# Patient Record
Sex: Female | Born: 1960 | Race: White | Hispanic: No | Marital: Married | Smoking: Current every day smoker
Health system: Southern US, Community
[De-identification: ages and names within clinical notes are randomized; demographics above are authoritative.]

---

## 2004-01-14 ENCOUNTER — Encounter: Admission: RE | Admit: 2004-01-14 | Discharge: 2004-01-14 | Payer: Self-pay | Admitting: Psychiatry

## 2005-04-08 ENCOUNTER — Emergency Department (HOSPITAL_COMMUNITY): Admission: EM | Admit: 2005-04-08 | Discharge: 2005-04-09 | Payer: Self-pay | Admitting: Emergency Medicine

## 2005-06-05 ENCOUNTER — Ambulatory Visit (HOSPITAL_COMMUNITY): Admission: RE | Admit: 2005-06-05 | Discharge: 2005-06-06 | Payer: Self-pay | Admitting: Neurosurgery

## 2011-01-07 ENCOUNTER — Encounter: Payer: Self-pay | Admitting: Unknown Physician Specialty

## 2013-08-14 ENCOUNTER — Telehealth: Payer: Self-pay | Admitting: Family Medicine

## 2013-08-14 NOTE — Telephone Encounter (Signed)
Patient needs something called in for her possible abscessed tooth to [facility]. Her husband came in this morning(David Lai) and was told that we could do this for her since the dentist is not open. Either antibiotic or pain medication. She has not been seen since 2009.

## 2013-08-14 NOTE — Telephone Encounter (Signed)
Called in call in penicillin VK 500 mg 1 4 times a day #28, hydrocodone 5 mg/325 mg 1 every 4 hours as needed for severe pain cautioned drowsiness #24 no refills to [facility]. Left message on voicemail notifying patient.

## 2013-08-14 NOTE — Telephone Encounter (Signed)
May call in penicillin VK 500 mg 1 4 times a day #28, hydrocodone 5 mg/325 mg 1 every 4 hours as needed for severe pain cautioned drowsiness #24 no refills, please make sure no allergies thank you, followup with dentist

## 2018-04-18 ENCOUNTER — Encounter (HOSPITAL_COMMUNITY): Payer: Self-pay

## 2018-04-18 ENCOUNTER — Emergency Department (HOSPITAL_COMMUNITY): Payer: Self-pay

## 2018-04-18 ENCOUNTER — Other Ambulatory Visit: Payer: Self-pay

## 2018-04-18 ENCOUNTER — Emergency Department (HOSPITAL_COMMUNITY)
Admission: EM | Admit: 2018-04-18 | Discharge: 2018-04-18 | Disposition: A | Discharge status: Home / Self Care | Payer: Self-pay | Attending: Emergency Medicine | Admitting: Emergency Medicine

## 2018-04-18 DIAGNOSIS — R202 Paresthesia of skin: Secondary | ICD-10-CM | POA: Insufficient documentation

## 2018-04-18 DIAGNOSIS — J9801 Acute bronchospasm: Secondary | ICD-10-CM | POA: Insufficient documentation

## 2018-04-18 DIAGNOSIS — J209 Acute bronchitis, unspecified: Secondary | ICD-10-CM

## 2018-04-18 DIAGNOSIS — J40 Bronchitis, not specified as acute or chronic: Secondary | ICD-10-CM | POA: Insufficient documentation

## 2018-04-18 DIAGNOSIS — F17210 Nicotine dependence, cigarettes, uncomplicated: Secondary | ICD-10-CM | POA: Insufficient documentation

## 2018-04-18 LAB — COMPREHENSIVE METABOLIC PANEL
ALT: 16 U/L (ref 14–54)
AST: 20 U/L (ref 15–41)
Albumin: 3.8 g/dL (ref 3.5–5.0)
Alkaline Phosphatase: 63 U/L (ref 38–126)
Anion gap: 12 (ref 5–15)
BUN: 10 mg/dL (ref 6–20)
CHLORIDE: 107 mmol/L (ref 101–111)
CO2: 21 mmol/L — ABNORMAL LOW (ref 22–32)
Calcium: 9.2 mg/dL (ref 8.9–10.3)
Creatinine, Ser: 0.7 mg/dL (ref 0.44–1.00)
GFR calc Af Amer: >60 mL/min (ref >60)
Glucose, Bld: 112 mg/dL — ABNORMAL HIGH (ref 65–99)
POTASSIUM: 3.4 mmol/L — AB (ref 3.5–5.1)
Sodium: 140 mmol/L (ref 135–145)
Total Bilirubin: 0.4 mg/dL (ref 0.3–1.2)
Total Protein: 6.9 g/dL (ref 6.5–8.1)

## 2018-04-18 LAB — CBC WITH DIFFERENTIAL/PLATELET
BASOS ABS: 0 10*3/uL (ref 0.0–0.1)
Basophils Relative: 1 %
Eosinophils Absolute: 0.2 10*3/uL (ref 0.0–0.7)
Eosinophils Relative: 3 %
HEMATOCRIT: 40 % (ref 36.0–46.0)
HEMOGLOBIN: 13.6 g/dL (ref 12.0–15.0)
Lymphocytes Relative: 28 %
Lymphs Abs: 1.8 10*3/uL (ref 0.7–4.0)
MCH: 33.5 pg (ref 26.0–34.0)
MCHC: 34 g/dL (ref 30.0–36.0)
MCV: 98.5 fL (ref 78.0–100.0)
Monocytes Absolute: 0.6 10*3/uL (ref 0.1–1.0)
Monocytes Relative: 9 %
NEUTROS ABS: 3.9 10*3/uL (ref 1.7–7.7)
Neutrophils Relative %: 59 %
Platelets: 284 10*3/uL (ref 150–400)
RBC: 4.06 MIL/uL (ref 3.87–5.11)
RDW: 12.6 % (ref 11.5–15.5)
WBC: 6.4 10*3/uL (ref 4.0–10.5)

## 2018-04-18 LAB — BRAIN NATRIURETIC PEPTIDE: B Natriuretic Peptide: 35 pg/mL (ref 0.0–100.0)

## 2018-04-18 LAB — TROPONIN I: Troponin I: <0.03 ng/mL (ref <0.03)

## 2018-04-18 LAB — ETHANOL: ALCOHOL ETHYL (B): 229 mg/dL — AB (ref <10)

## 2018-04-18 LAB — D-DIMER, QUANTITATIVE (NOT AT ARMC): D DIMER QUANT: 0.27 ug{FEU}/mL (ref 0.00–0.50)

## 2018-04-18 MED ORDER — PREDNISONE 20 MG PO TABS: ORAL_TABLET | ORAL | 0 refills | Status: DC

## 2018-04-18 MED ORDER — AMOXICILLIN 500 MG PO CAPS: 500.0000 mg | ORAL_CAPSULE | Freq: Three times a day (TID) | ORAL | 0 refills | Status: DC

## 2018-04-18 MED ORDER — ALBUTEROL SULFATE (2.5 MG/3ML) 0.083% IN NEBU
5.0000 mg | INHALATION_SOLUTION | Freq: Once | RESPIRATORY_TRACT | Status: AC
  Administered 2018-04-18: 5 mg via RESPIRATORY_TRACT
  Filled 2018-04-18: qty 6

## 2018-04-18 MED ORDER — AEROCHAMBER Z-STAT PLUS/MEDIUM MISC
1.0000 | Freq: Once | Status: AC
  Administered 2018-04-18: 1
  Filled 2018-04-18: qty 1

## 2018-04-18 MED ORDER — ALBUTEROL SULFATE HFA 108 (90 BASE) MCG/ACT IN AERS
2.0000 | INHALATION_SPRAY | Freq: Four times a day (QID) | RESPIRATORY_TRACT | Status: DC | PRN
  Administered 2018-04-18: 2 via RESPIRATORY_TRACT
  Filled 2018-04-18: qty 6.7

## 2018-04-18 NOTE — ED Triage Notes (Signed)
Pt arrives from home c/o difficulty breathing, accompanied with tingling in her arms. States symptoms have been occurring X 2 days.

## 2018-04-18 NOTE — ED Notes (Signed)
Patient transported to CT 

## 2018-04-18 NOTE — Discharge Instructions (Signed)
Try to stop smoking and cut back on your alcohol ingestion. Take the antibiotics and the prednisone until gone. Use the inhaler 2 puffs every 6 hrs as needed for wheezing or shortness of breath. Please let Dr. Gerda Diss know about the numbness you have been having in your left hand if it continues. Return to the ED if you get a fever, struggle to breathe despite using your inhaler, if you get weakness in an arm or leg or you feel worse.

## 2018-04-18 NOTE — ED Notes (Signed)
ED Provider at bedside. 

## 2018-04-18 NOTE — ED Notes (Signed)
Pt returned from CT Scan 

## 2018-04-18 NOTE — ED Provider Notes (Signed)
[facility] EMERGENCY DEPARTMENT Provider Note   CSN: [id number] Arrival date & time: 04/18/18  0022  Time seen 1:15 AM   History   Chief Complaint Chief Complaint  Patient presents with  . Shortness of Breath    HPI Kimberly Thompson is a 57 y.o. female.  HPI patient states about 7 PM this evening she started getting short of breath that she states was gradually and it got worse.  She states she has had a cough for a few months.  She states she has been wheezing and has had a nebulizer and inhaler in the past but does not have one now.  She states she is coughing up clear mucus and she has sneezing and clear rhinorrhea.  She denies sore throat but does have nausea without vomiting.  She states she has been having headaches for a few weeks that come and go and she had it earlier but it is gone now.  She denies chest pain.  She did states she has some neck pain, she calls that shoulder pain however she puts her hand on her neck.  She also complains of left upper extremity numbness and weakness off and on for the past 3 weeks that she states last from 1 to 2 to 3 minutes at a time.  She states she is right-handed and feels like she is weaker in her right hand than her left currently.  Her son states tonight they were trying to help her to go out to the car to come to the ED and she started having tunnel vision and then could not see.  She has been complaining of visual problems also for the last couple weeks.  PCP Babs Sciara, MD   History reviewed. No pertinent past medical history.  There are no active problems to display for this patient.   History reviewed. No pertinent surgical history.   OB History    Gravida  1   Para  1   Term  1   Preterm      AB      Living  1     SAB      TAB      Ectopic      Multiple      Live Births               Home Medications    Prior to Admission medications   Medication Sig Start Date End Date Taking? Authorizing  Provider  amoxicillin (AMOXIL) 500 MG capsule Take 1 capsule (500 mg total) by mouth 3 (three) times daily. 04/18/18   Devoria Albe, MD  predniSONE (DELTASONE) 20 MG tablet Take 3 po QD x 3d , then 2 po QD x 3d then 1 po QD x 3d 04/18/18   Devoria Albe, MD    Family History History reviewed. No pertinent family history.  Social History Social History   Tobacco Use  . Smoking status: Current Every Day Smoker    Packs/day: 1.00    Years: 25.00    Pack years: 25.00    Types: Cigarettes  Substance Use Topics  . Alcohol use: Yes    Alcohol/week: 3.0 oz    Types: 5 Cans of beer per week  . Drug use: Not Currently  unemployed Drinks 4-6 beers nightly including tonight   Allergies   Patient has no allergy information on record.   Review of Systems Review of Systems  All other systems reviewed and are negative.  Physical Exam Updated Vital Signs BP 109/82   Pulse 99   Temp 97.8 F (36.6 C) (Oral)   Resp (!) 23   Ht 5' 2.5" (1.588 m)   Wt 81.6 kg (180 lb)   LMP  (LMP Unknown)   SpO2 98%   BMI 32.40 kg/m   Vital signs normal    Physical Exam  Constitutional: She is oriented to person, place, and time. She appears well-developed and well-nourished.  Non-toxic appearance. She does not appear ill. No distress.  HENT:  Head: Normocephalic and atraumatic.  Right Ear: External ear normal.  Left Ear: External ear normal.  Nose: Nose normal. No mucosal edema or rhinorrhea.  Mouth/Throat: Oropharynx is clear and moist and mucous membranes are normal. No dental abscesses or uvula swelling.  Eyes: Pupils are equal, round, and reactive to light. Conjunctivae and EOM are normal.  Neck: Normal range of motion and full passive range of motion without pain. Neck supple.  Pt doesn't have pain to palpation of her cerivcal spine but she puts her hand on the back   Cardiovascular: Normal rate, regular rhythm and normal heart sounds. Exam reveals no gallop and no friction rub.  No murmur  heard. Pulmonary/Chest: Effort normal. No respiratory distress. She has decreased breath sounds. She has no wheezes. She has no rhonchi. She has no rales. She exhibits no tenderness and no crepitus.  When patient coughs she has a tight sounding cough and son states they heard her wheezing at home.   Abdominal: Soft. Normal appearance and bowel sounds are normal. She exhibits no distension. There is no tenderness. There is no rebound and no guarding.  Musculoskeletal: Normal range of motion. She exhibits no edema or tenderness.  Moves all extremities well.   Neurological: She is alert and oriented to person, place, and time. She has normal strength. No cranial nerve deficit.  Grips are equal bilaterally, there is no pronator drift, she has no lower motor weakness.  Skin: Skin is warm, dry and intact. No rash noted. No erythema. No pallor.  Face is flushed  Psychiatric: She has a normal mood and affect. Her speech is normal and behavior is normal. Her mood appears not anxious.  Nursing note and vitals reviewed.    ED Treatments / Results  Labs (all labs ordered are listed, but only abnormal results are displayed) Results for orders placed or performed during the hospital encounter of 04/18/18  Comprehensive metabolic panel  Result Value Ref Range   Sodium 140 135 - 145 mmol/L   Potassium 3.4 (L) 3.5 - 5.1 mmol/L   Chloride 107 101 - 111 mmol/L   CO2 21 (L) 22 - 32 mmol/L   Glucose, Bld 112 (H) 65 - 99 mg/dL   BUN 10 6 - 20 mg/dL   Creatinine, Ser 1.61 0.44 - 1.00 mg/dL   Calcium 9.2 8.9 - 09.6 mg/dL   Total Protein 6.9 6.5 - 8.1 g/dL   Albumin 3.8 3.5 - 5.0 g/dL   AST 20 15 - 41 U/L   ALT 16 14 - 54 U/L   Alkaline Phosphatase 63 38 - 126 U/L   Total Bilirubin 0.4 0.3 - 1.2 mg/dL   GFR calc non Af Amer >60 >60 mL/min   GFR calc Af Amer >60 >60 mL/min   Anion gap 12 5 - 15  Ethanol  Result Value Ref Range   Alcohol, Ethyl (B) 229 (H) <10 mg/dL  CBC with Differential  Result  Value Ref Range  WBC 6.4 4.0 - 10.5 K/uL   RBC 4.06 3.87 - 5.11 MIL/uL   Hemoglobin 13.6 12.0 - 15.0 g/dL   HCT 62.1 30.8 - 65.7 %   MCV 98.5 78.0 - 100.0 fL   MCH 33.5 26.0 - 34.0 pg   MCHC 34.0 30.0 - 36.0 g/dL   RDW 84.6 96.2 - 95.2 %   Platelets 284 150 - 400 K/uL   Neutrophils Relative % 59 %   Neutro Abs 3.9 1.7 - 7.7 K/uL   Lymphocytes Relative 28 %   Lymphs Abs 1.8 0.7 - 4.0 K/uL   Monocytes Relative 9 %   Monocytes Absolute 0.6 0.1 - 1.0 K/uL   Eosinophils Relative 3 %   Eosinophils Absolute 0.2 0.0 - 0.7 K/uL   Basophils Relative 1 %   Basophils Absolute 0.0 0.0 - 0.1 K/uL  Troponin I  Result Value Ref Range   Troponin I <0.03 <0.03 ng/mL  Brain natriuretic peptide  Result Value Ref Range   B Natriuretic Peptide 35.0 0.0 - 100.0 pg/mL  D-dimer, quantitative  Result Value Ref Range   D-Dimer, Quant 0.27 0.00 - 0.50 ug/mL-FEU   Laboratory interpretation all normal except alcohol intoxication    EKG EKG Interpretation  Date/Time:  Friday Apr 18 2018 00:32:45 EDT Ventricular Rate:  106 PR Interval:    QRS Duration: 104 QT Interval:  335 QTC Calculation: 445 R Axis:   90 Text Interpretation:  Sinus tachycardia Left atrial enlargement Borderline right axis deviation Baseline wander No old tracing to compare Confirmed by Devoria Albe ([id number]) on 04/18/2018 1:03:40 AM   Radiology Dg Chest 2 View  Result Date: 04/18/2018 CLINICAL DATA:  Cough and shortness of breath EXAM: CHEST - 2 VIEW COMPARISON:  06/01/2005 FINDINGS: The heart size and mediastinal contours are within normal limits. Both lungs are clear. The visualized skeletal structures are unremarkable. IMPRESSION: No active cardiopulmonary disease. Electronically Signed   By: Deatra Robinson M.D.   On: 04/18/2018 02:46   Ct Head Wo Contrast Ct Cervical Spine Wo Contrast  Result Date: 04/18/2018 CLINICAL DATA:  57 year old female with upper extremity tingling. EXAM: CT HEAD WITHOUT CONTRAST CT CERVICAL SPINE  WITHOUT CONTRAST TECHNIQUE: Multidetector CT imaging of the head and cervical spine was performed following the standard protocol without intravenous contrast. Multiplanar CT image reconstructions of the cervical spine were also generated. COMPARISON:  Head CT dated 04/08/2005 FINDINGS: CT HEAD FINDINGS Brain: There is mild age-related atrophy and chronic microvascular ischemic changes. There is no acute intracranial hemorrhage. No mass effect or midline shift. No extra-axial fluid collection. Vascular: No hyperdense vessel or unexpected calcification. Skull: Normal. Negative for fracture or focal lesion. Sinuses/Orbits: Diffuse mucoperiosteal thickening of paranasal sinuses. No air-fluid levels. Cerumen noted in the external auditory canals bilaterally. Other: None CT CERVICAL SPINE FINDINGS Alignment: Normal. Skull base and vertebrae: No acute fracture. No primary bone lesion or focal pathologic process. Soft tissues and spinal canal: No prevertebral fluid or swelling. No visible canal hematoma. Disc levels: No acute findings. No significant degenerative changes. Upper chest: Negative. Other: None IMPRESSION: 1. No acute intracranial hemorrhage. 2. Mild age-related atrophy and chronic microvascular ischemic changes. 3. No acute/traumatic cervical spine pathology. Electronically Signed   By: Elgie Collard M.D.   On: 04/18/2018 02:43    Procedures Procedures (including critical care time)  Medications Ordered in ED Medications  albuterol (PROVENTIL HFA;VENTOLIN HFA) 108 (90 Base) MCG/ACT inhaler 2 puff (has no administration in time range)  aerochamber Z-Stat Plus/medium 1 each (  has no administration in time range)  albuterol (PROVENTIL) (2.5 MG/3ML) 0.083% nebulizer solution 5 mg (5 mg Nebulization Given 04/18/18 0152)     Initial Impression / Assessment and Plan / ED Course  I have reviewed the triage vital signs and the nursing notes.  Pertinent labs & imaging results that were available during  my care of the patient were reviewed by me and considered in my medical decision making (see chart for details).     Patient was given albuterol nebulizer to see if that would help with some of her respiratory symptoms.  Head CT and of the cervical spine were done to evaluate her complaints of numbness or tingling in her left upper extremity.  I did not feel like she was having stroke symptoms because she states the symptoms come and go and only last a few minutes at a time, she may have some type of radiculopathy from some arthritis in her neck.  Laboratory testing was done to also look for electrolyte abnormalities which could cause her paresthesias.    Recheck at 5:30 AM patient sleeping soundly.  She states her breathing is better.  When I listen to her now she has some scattered rhonchi.  I offered a another nebulizer treatment however she does not seem interested in getting another one.  I had respiratory therapy show her how to use a albuterol inhaler for spacer.  She was discharged home.  We discussed she needs to cut back on her alcohol and smoking.  Final Clinical Impressions(s) / ED Diagnoses   Final diagnoses:  Bronchitis with bronchospasm  Paresthesia of left upper extremity    ED Discharge Orders        Ordered    predniSONE (DELTASONE) 20 MG tablet     04/18/18 0542    amoxicillin (AMOXIL) 500 MG capsule  3 times daily     04/18/18 0542      Plan discharge  Devoria Albe, MD, Concha Pyo, MD 04/18/18 [phone]

## 2019-05-25 IMAGING — CT CT CERVICAL SPINE W/O CM
4 of 7 series · 14 of 33 positions shown, 15 images · non-contrast
Comparison: Head CT dated 04/08/2005

CLINICAL DATA: 56-year-old female with upper extremity tingling.

EXAM:
CT HEAD WITHOUT CONTRAST
CT CERVICAL SPINE WITHOUT CONTRAST
TECHNIQUE: Multidetector CT imaging of the head and cervical spine was
performed following the standard protocol without intravenous
contrast. Multiplanar CT image reconstructions of the cervical spine
were also generated.

[Series 9: c spine soft · axial · 0.23mm/px · z∈[+114,+202]mm · 4 of 74 slices shown]
[im 15/74  soft-tissue]
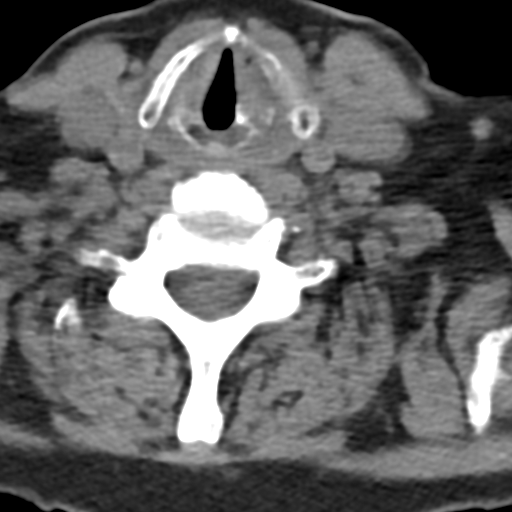
[im 30/74  soft-tissue]
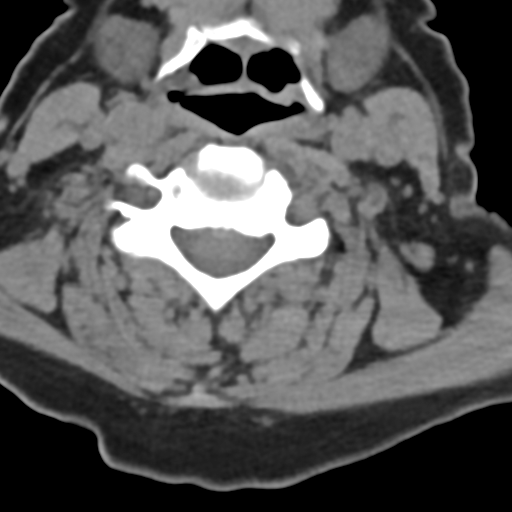
[im 44/74  soft-tissue]
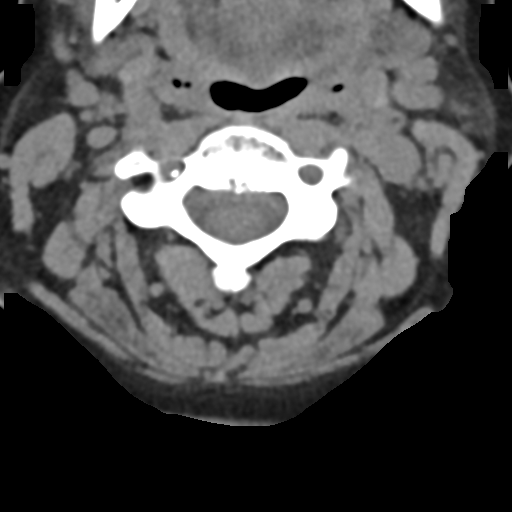
[im 59/74  soft-tissue]
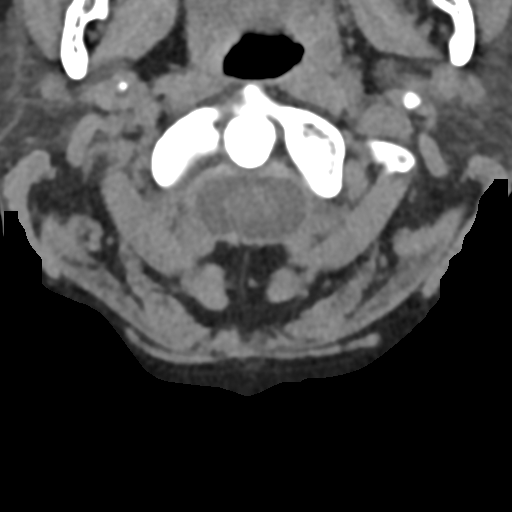

[Series 10: sagittal bone · sagittal · 0.23mm/px · 5 of 61 slices shown]
[im 11/61  bone]
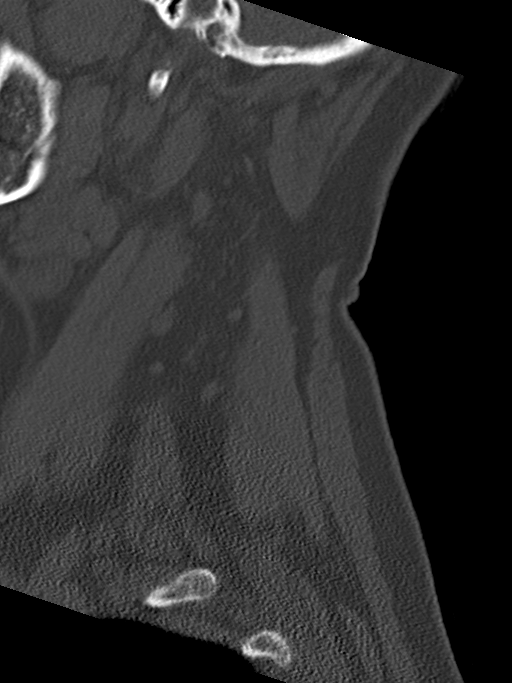
[im 21/61  bone]
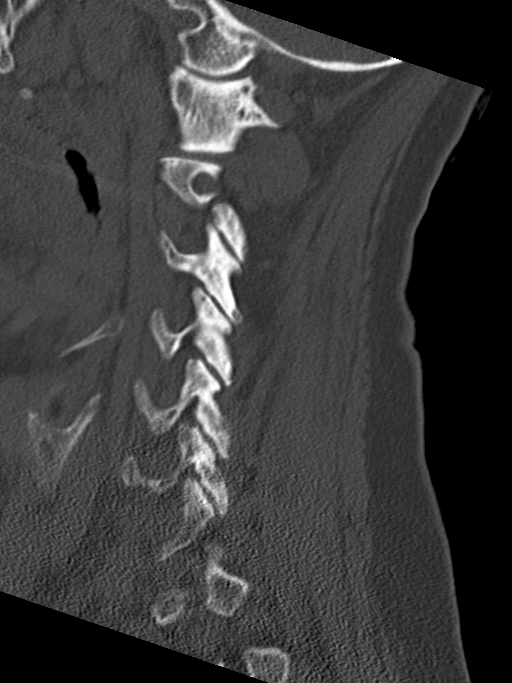
[im 31/61  bone]
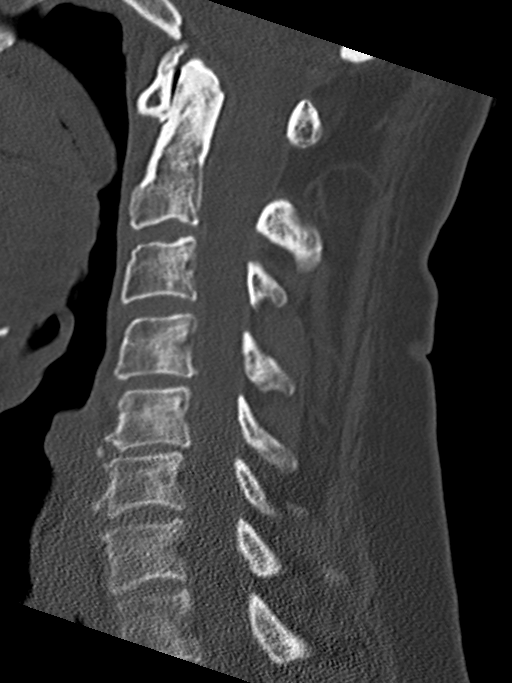
[im 41/61  bone]
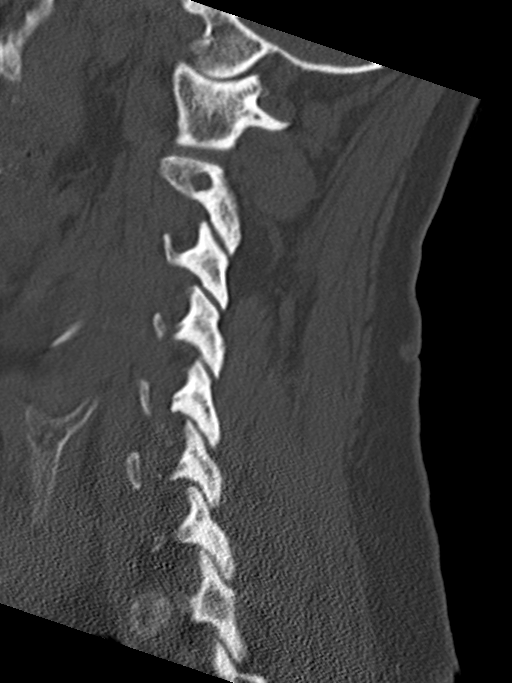
[im 51/61  bone]
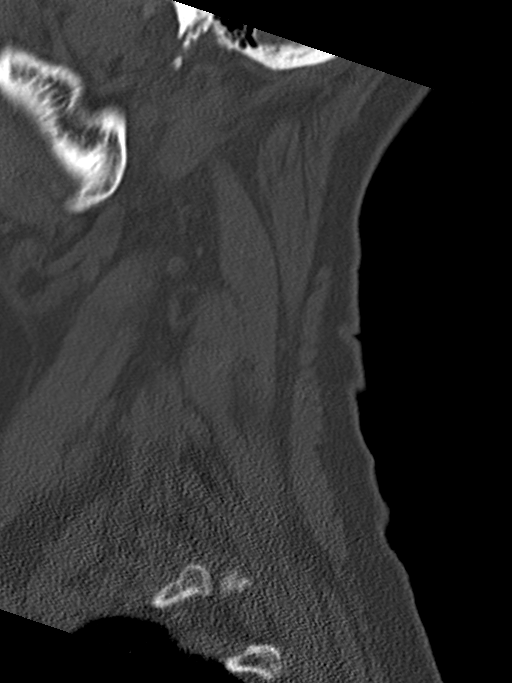

[Series 11: coronal bone · coronal · 0.23mm/px · 1 of 61 slices shown]
[im 31/61  bone]
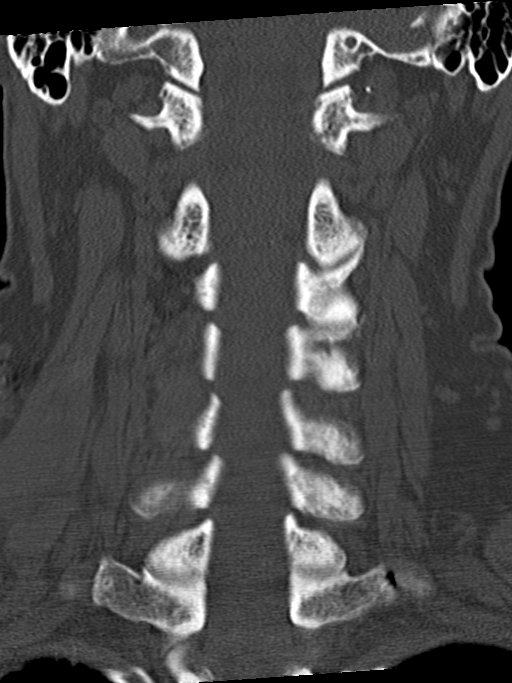

[Series 12: orthogonal bone · axial · 0.21mm/px · z∈[+102,+198]mm · 4 of 78 slices shown, 5 images]
[im 16/78  soft-tissue]
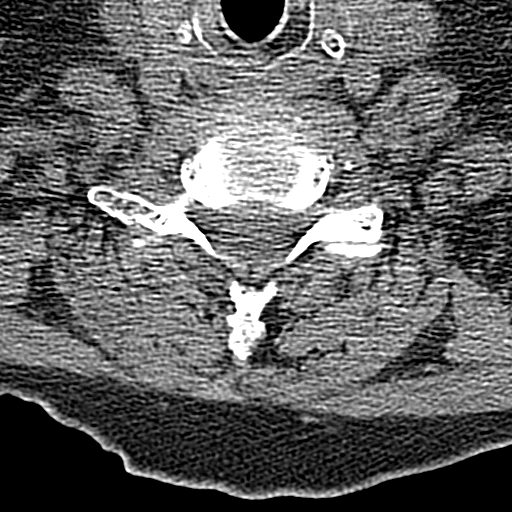
[im 16/78  bone]
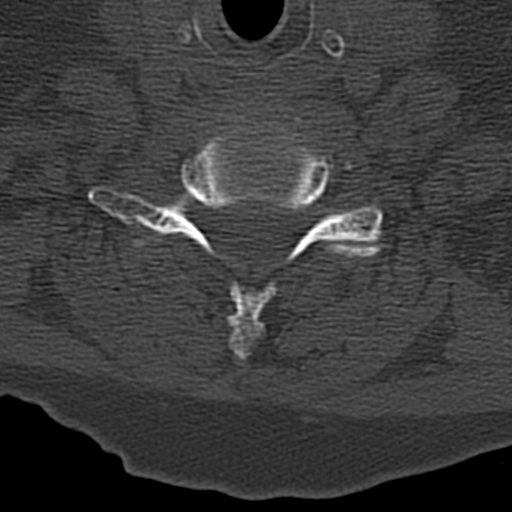
[im 31/78  bone]
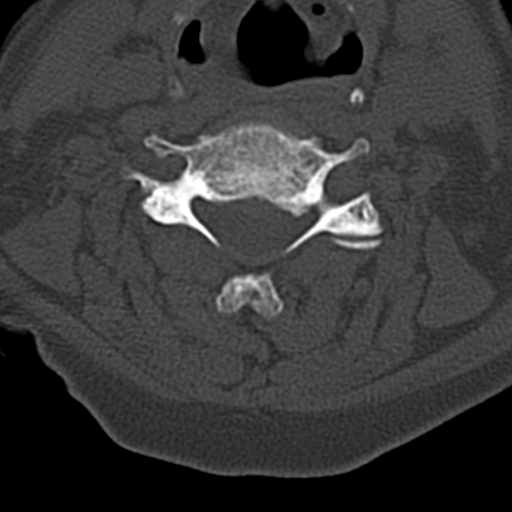
[im 47/78  bone]
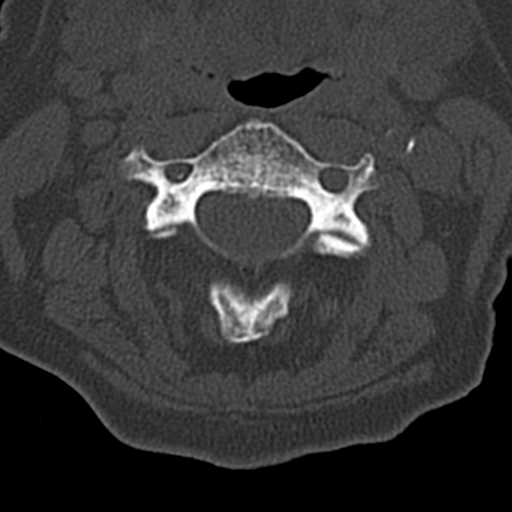
[im 62/78  bone]
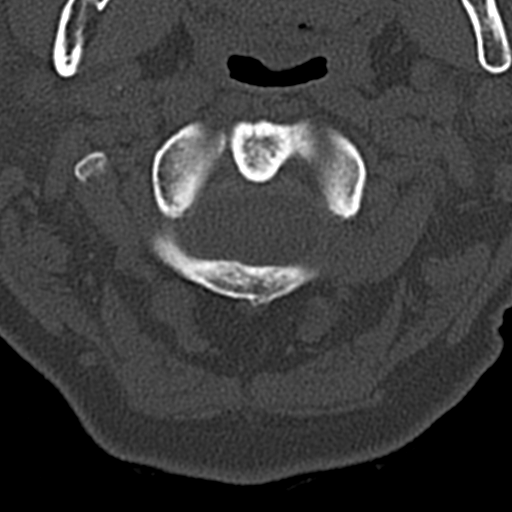

[14 of 33 positions shown; findings below may reference images not displayed]

FINDINGS: CT HEAD FINDINGS

Brain: There is mild age-related atrophy and chronic microvascular
ischemic changes. There is no acute intracranial hemorrhage. No mass
effect or midline shift. No extra-axial fluid collection.

Vascular: No hyperdense vessel or unexpected calcification.

Skull: Normal. Negative for fracture or focal lesion.

Sinuses/Orbits: Diffuse mucoperiosteal thickening of paranasal
sinuses. No air-fluid levels. Cerumen noted in the external auditory
canals bilaterally.

Other: None

CT CERVICAL SPINE FINDINGS

Alignment: Normal.

Skull base and vertebrae: No acute fracture. No primary bone lesion
or focal pathologic process.

Soft tissues and spinal canal: No prevertebral fluid or swelling. No
visible canal hematoma.

Disc levels: No acute findings. No significant degenerative changes.

Upper chest: Negative.

Other: None
IMPRESSION: 1. No acute intracranial hemorrhage.
2. Mild age-related atrophy and chronic microvascular ischemic
changes.
3. No acute/traumatic cervical spine pathology.

## 2020-05-03 ENCOUNTER — Encounter (HOSPITAL_COMMUNITY): Payer: Self-pay | Admitting: Emergency Medicine

## 2020-05-03 ENCOUNTER — Emergency Department (HOSPITAL_COMMUNITY)
Admission: EM | Admit: 2020-05-03 | Discharge: 2020-05-03 | Disposition: A | Discharge status: Home / Self Care | Payer: Self-pay | Attending: Emergency Medicine | Admitting: Emergency Medicine

## 2020-05-03 ENCOUNTER — Ambulatory Visit: Payer: Self-pay

## 2020-05-03 ENCOUNTER — Other Ambulatory Visit: Payer: Self-pay

## 2020-05-03 ENCOUNTER — Emergency Department (HOSPITAL_COMMUNITY): Payer: Self-pay

## 2020-05-03 DIAGNOSIS — F17210 Nicotine dependence, cigarettes, uncomplicated: Secondary | ICD-10-CM | POA: Insufficient documentation

## 2020-05-03 DIAGNOSIS — R200 Anesthesia of skin: Secondary | ICD-10-CM | POA: Insufficient documentation

## 2020-05-03 LAB — CBC WITH DIFFERENTIAL/PLATELET
Abs Immature Granulocytes: 0.01 10*3/uL (ref 0.00–0.07)
Basophils Absolute: 0.1 10*3/uL (ref 0.0–0.1)
Basophils Relative: 1 %
Eosinophils Absolute: 0.2 10*3/uL (ref 0.0–0.5)
Eosinophils Relative: 4 %
HCT: 41.2 % (ref 36.0–46.0)
Hemoglobin: 14 g/dL (ref 12.0–15.0)
Immature Granulocytes: 0 %
Lymphocytes Relative: 33 %
Lymphs Abs: 2.1 10*3/uL (ref 0.7–4.0)
MCH: 33.3 pg (ref 26.0–34.0)
MCHC: 34 g/dL (ref 30.0–36.0)
MCV: 98.1 fL (ref 80.0–100.0)
Monocytes Absolute: 0.7 10*3/uL (ref 0.1–1.0)
Monocytes Relative: 10 %
Neutro Abs: 3.3 10*3/uL (ref 1.7–7.7)
Neutrophils Relative %: 52 %
Platelets: 294 10*3/uL (ref 150–400)
RBC: 4.2 MIL/uL (ref 3.87–5.11)
RDW: 11.7 % (ref 11.5–15.5)
WBC: 6.3 10*3/uL (ref 4.0–10.5)
nRBC: 0 % (ref 0.0–0.2)

## 2020-05-03 LAB — COMPREHENSIVE METABOLIC PANEL
ALT: 18 U/L (ref 0–44)
AST: 24 U/L (ref 15–41)
Albumin: 4.1 g/dL (ref 3.5–5.0)
Alkaline Phosphatase: 65 U/L (ref 38–126)
Anion gap: 10 (ref 5–15)
BUN: 8 mg/dL (ref 6–20)
CO2: 25 mmol/L (ref 22–32)
Calcium: 8.9 mg/dL (ref 8.9–10.3)
Chloride: 100 mmol/L (ref 98–111)
Creatinine, Ser: 0.51 mg/dL (ref 0.44–1.00)
GFR calc Af Amer: >60 mL/min (ref >60)
GFR calc non Af Amer: >60 mL/min (ref >60)
Glucose, Bld: 94 mg/dL (ref 70–99)
Potassium: 3.7 mmol/L (ref 3.5–5.1)
Sodium: 135 mmol/L (ref 135–145)
Total Bilirubin: 0.6 mg/dL (ref 0.3–1.2)
Total Protein: 7.4 g/dL (ref 6.5–8.1)

## 2020-05-03 LAB — TROPONIN I (HIGH SENSITIVITY): Troponin I (High Sensitivity): 3 ng/L (ref <18)

## 2020-05-03 NOTE — ED Notes (Signed)
This nurse went to the room to check on the patient and the room was empty. Langston Masker PA notified.

## 2020-05-03 NOTE — Telephone Encounter (Signed)
Pt. Reports she has a history of asthma and for 2-3 weeks has shortness of breath, "mainly at night, it wakes me up. I wake up coughing and cough up mucous." "After that I feel better." Has wheezing at intervals.Denies any chest pain. States "my fingers are white in color." Instructed to go to ED for shortness of breath and chest pain. Given address and phone number for free clinic in [place], [place].C.Verbalizes understanding.  Answer Assessment - Initial Assessment Questions 1. RESPIRATORY STATUS: "Describe your breathing?" (e.g., wheezing, shortness of breath, unable to speak, severe coughing)      Shortness of breath at night 2. ONSET: "When did this breathing problem begin?"      Started 2-3 weeks ago 3. PATTERN "Does the difficult breathing come and go, or has it been constant since it started?"      Mainly at night 4. SEVERITY: "How bad is your breathing?" (e.g., mild, moderate, severe)    - MILD: No SOB at rest, mild SOB with walking, speaks normally in sentences, can lay down, no retractions, pulse < 100.    - MODERATE: SOB at rest, SOB with minimal exertion and prefers to sit, cannot lie down flat, speaks in phrases, mild retractions, audible wheezing, pulse 100-120.    - SEVERE: Very SOB at rest, speaks in single words, struggling to breathe, sitting hunched forward, retractions, pulse > 120      Moderate 5. RECURRENT SYMPTOM: "Have you had difficulty breathing before?" If so, ask: "When was the last time?" and "What happened that time?"      No 6. CARDIAC HISTORY: "Do you have any history of heart disease?" (e.g., heart attack, angina, bypass surgery, angioplasty)      No 7. LUNG HISTORY: "Do you have any history of lung disease?"  (e.g., pulmonary embolus, asthma, emphysema)     History of asthma 8. CAUSE: "What do you think is causing the breathing problem?"      Unsure 9. OTHER SYMPTOMS: "Do you have any other symptoms? (e.g., dizziness, runny nose, cough, chest pain, fever)  Productive cough 10. PREGNANCY: "Is there any chance you are pregnant?" "When was your last menstrual period?"       No 11. TRAVEL: "Have you traveled out of the country in the last month?" (e.g., travel history, exposures)       No  Protocols used: BREATHING DIFFICULTY-A-AH

## 2020-05-03 NOTE — ED Provider Notes (Signed)
[facility] EMERGENCY DEPARTMENT Provider Note   CSN: [id number] Arrival date & time: 05/03/20  1650     History Chief Complaint  Patient presents with  . Numbness    Kimberly Thompson is a 59 y.o. female.  Pt reports her fingers look pale.  Pt reports she has also had some tingling in her fingers.  Pt states she hs had some discomfort in the right side of her chest.  Pt reports something feels wrong.  The history is provided by the patient. No language interpreter was used.  Chest Pain Pain location:  R chest Pain quality: aching   Pain radiates to:  Does not radiate Pain severity:  Mild Onset quality:  Gradual Timing:  Constant Progression:  Worsening Chronicity:  New Relieved by:  Nothing Worsened by:  Nothing Ineffective treatments:  None tried      History reviewed. No pertinent past medical history.  There are no problems to display for this patient.   History reviewed. No pertinent surgical history.   OB History    Gravida  1   Para  1   Term  1   Preterm      AB      Living  1     SAB      TAB      Ectopic      Multiple      Live Births              History reviewed. No pertinent family history.  Social History   Tobacco Use  . Smoking status: Current Every Day Smoker    Packs/day: 1.00    Years: 25.00    Pack years: 25.00    Types: Cigarettes  . Smokeless tobacco: Never Used  Substance Use Topics  . Alcohol use: Yes    Alcohol/week: 5.0 standard drinks    Types: 5 Cans of beer per week  . Drug use: Not Currently    Home Medications Prior to Admission medications   Medication Sig Start Date End Date Taking? Authorizing Provider  amoxicillin (AMOXIL) 500 MG capsule Take 1 capsule (500 mg total) by mouth 3 (three) times daily. 04/18/18   Rolland Porter, MD  predniSONE (DELTASONE) 20 MG tablet Take 3 po QD x 3d , then 2 po QD x 3d then 1 po QD x 3d 04/18/18   Rolland Porter, MD    Allergies    Patient has no known  allergies.  Review of Systems   Review of Systems  Cardiovascular: Positive for chest pain.  All other systems reviewed and are negative.   Physical Exam Updated Vital Signs BP 122/87   Pulse 78   Temp 98.1 F (36.7 C) (Oral)   Resp 16   Ht 5\' 2"  (1.575 m)   Wt 62.1 kg   LMP  (LMP Unknown)   SpO2 95%   BMI 25.02 kg/m   Physical Exam Vitals and nursing note reviewed.  Constitutional:      Appearance: She is well-developed.  HENT:     Head: Normocephalic.  Cardiovascular:     Rate and Rhythm: Normal rate.  Pulmonary:     Effort: Pulmonary effort is normal.  Abdominal:     General: There is no distension.  Musculoskeletal:        General: Normal range of motion.     Cervical back: Normal range of motion.  Skin:    General: Skin is warm.     Capillary Refill: Capillary refill  takes less than 2 seconds.  Neurological:     General: No focal deficit present.     Mental Status: She is alert and oriented to person, place, and time.  Psychiatric:        Mood and Affect: Mood normal.     ED Results / Procedures / Treatments   Labs (all labs ordered are listed, but only abnormal results are displayed) Labs Reviewed  CBC WITH DIFFERENTIAL/PLATELET  COMPREHENSIVE METABOLIC PANEL  TROPONIN I (HIGH SENSITIVITY)    EKG EKG Interpretation  Date/Time:  Tuesday May 03 2020 18:27:49 EDT Ventricular Rate:  84 PR Interval:    QRS Duration: 102 QT Interval:  379 QTC Calculation: 448 R Axis:   92 Text Interpretation: Sinus rhythm Borderline right axis deviation Nonspecific ST changes similar to 2019 tracing No STEMI Confirmed by Alona Bene [phone]) on 05/03/2020 6:44:05 PM   Radiology DG Chest Port 1 View  Result Date: 05/03/2020 CLINICAL DATA:  Shortness of breath for 3 weeks. Productive cough and wheezing. Asthma. EXAM: PORTABLE CHEST 1 VIEW COMPARISON:  04/18/2018 FINDINGS: The heart size and mediastinal contours are within normal limits. Pulmonary hyperinflation  again seen, consistent with COPD. Both lungs are clear. The visualized skeletal structures are unremarkable. IMPRESSION: COPD. No active disease. Electronically Signed   By: Danae Orleans M.D.   On: 05/03/2020 18:55    Procedures Procedures (including critical care time)  Medications Ordered in ED Medications - No data to display  ED Course  I have reviewed the triage vital signs and the nursing notes.  Pertinent labs & imaging results that were available during my care of the patient were reviewed by me and considered in my medical decision making (see chart for details).    MDM Rules/Calculators/A&P                      MDM: Pt's EKg unchanged, chest xray no acute abnormality,  Pt left before labs returned  Labs reviewed and are normal.  Final Clinical Impression(s) / ED Diagnoses Final diagnoses:  Numbness    Rx / DC Orders ED Discharge Orders    None       Osie Cheeks 05/03/20 2259    Maia Plan, MD 05/16/20 1055

## 2020-05-03 NOTE — ED Triage Notes (Addendum)
Pt reports c/o cold hands and fingertips tingling and feeling numb, isolated to fingertips only; pt also c/o cough

## 2021-12-02 ENCOUNTER — Encounter (HOSPITAL_COMMUNITY): Payer: Self-pay

## 2021-12-02 ENCOUNTER — Emergency Department (HOSPITAL_COMMUNITY)
Admission: EM | Admit: 2021-12-02 | Discharge: 2021-12-03 | Disposition: A | Discharge status: Home / Self Care | Payer: Self-pay | Attending: Emergency Medicine | Admitting: Emergency Medicine

## 2021-12-02 ENCOUNTER — Other Ambulatory Visit: Payer: Self-pay

## 2021-12-02 ENCOUNTER — Emergency Department (HOSPITAL_COMMUNITY): Payer: Self-pay

## 2021-12-02 DIAGNOSIS — Z5321 Procedure and treatment not carried out due to patient leaving prior to being seen by health care provider: Secondary | ICD-10-CM | POA: Insufficient documentation

## 2021-12-02 DIAGNOSIS — R079 Chest pain, unspecified: Secondary | ICD-10-CM

## 2021-12-02 DIAGNOSIS — M79602 Pain in left arm: Secondary | ICD-10-CM | POA: Insufficient documentation

## 2021-12-02 DIAGNOSIS — R072 Precordial pain: Secondary | ICD-10-CM | POA: Insufficient documentation

## 2021-12-02 DIAGNOSIS — R1110 Vomiting, unspecified: Secondary | ICD-10-CM | POA: Insufficient documentation

## 2021-12-02 LAB — CBC
HCT: 41.4 % (ref 36.0–46.0)
Hemoglobin: 14.1 g/dL (ref 12.0–15.0)
MCH: 33.6 pg (ref 26.0–34.0)
MCHC: 34.1 g/dL (ref 30.0–36.0)
MCV: 98.6 fL (ref 80.0–100.0)
Platelets: 265 10*3/uL (ref 150–400)
RBC: 4.2 MIL/uL (ref 3.87–5.11)
RDW: 12.7 % (ref 11.5–15.5)
WBC: 8 10*3/uL (ref 4.0–10.5)
nRBC: 0 % (ref 0.0–0.2)

## 2021-12-02 LAB — BASIC METABOLIC PANEL
Anion gap: 12 (ref 5–15)
BUN: 10 mg/dL (ref 6–20)
CO2: 22 mmol/L (ref 22–32)
Calcium: 9 mg/dL (ref 8.9–10.3)
Chloride: 98 mmol/L (ref 98–111)
Creatinine, Ser: 0.56 mg/dL (ref 0.44–1.00)
GFR, Estimated: >60 mL/min (ref >60)
Glucose, Bld: 102 mg/dL — ABNORMAL HIGH (ref 70–99)
Potassium: 4 mmol/L (ref 3.5–5.1)
Sodium: 132 mmol/L — ABNORMAL LOW (ref 135–145)

## 2021-12-02 LAB — TROPONIN I (HIGH SENSITIVITY): Troponin I (High Sensitivity): 5 ng/L (ref <18)

## 2021-12-02 MED ORDER — ASPIRIN 81 MG PO CHEW: 324.0000 mg | CHEWABLE_TABLET | Freq: Once | ORAL | Status: DC

## 2021-12-02 MED ORDER — ALUM & MAG HYDROXIDE-SIMETH 200-200-20 MG/5ML PO SUSP
30.0000 mL | Freq: Once | ORAL | Status: AC
  Administered 2021-12-02: 30 mL via ORAL
  Filled 2021-12-02: qty 30

## 2021-12-02 NOTE — ED Triage Notes (Signed)
Pt arrived via POV c/o mid sternal chest pain, left arm pain and endorses 2 episodes of emesis since 1430 today. Pt was doing minimal house work today when the chest pain began.

## 2021-12-02 NOTE — ED Notes (Signed)
Pt ambulated to the bathroom unassisted after unhooking themselves off the monitor

## 2021-12-02 NOTE — ED Notes (Signed)
Pt back from x-ray.

## 2021-12-03 NOTE — ED Notes (Signed)
Pt frustrated and wants to leave. Educated her that edp was in an emergency and would get to her asap; if she left it would be considered ama. Pt stated that she would "wait a little bit longer"

## 2021-12-03 NOTE — ED Notes (Signed)
Pt decided to leave. States "my anxiety was getting too much and I feel like I need to get out of here, I'm not sure I need to be here anyways."

## 2022-06-14 ENCOUNTER — Telehealth: Payer: Self-pay | Admitting: Family Medicine

## 2022-06-14 NOTE — Telephone Encounter (Signed)
FYI- Patient 's friend came in wanting to sign a paper to be able to get her medical records unfortunately this patient has not been seen in our office since we have been on Epic we have no records on her at all. And we unable to give out any records without patient permission.

## 2022-06-15 NOTE — Telephone Encounter (Signed)
I did discuss with her friend who is trying to help her that she would need to be reestablished as a patient.  If Kimberly Thompson does call and desires to be seen I am willing to see her again.  She is having some health issues and I would be willing to see her to reestablish her in a open slot.  He is aware and will tell her.  Thank you

## 2023-10-09 ENCOUNTER — Encounter: Payer: Self-pay | Admitting: Family Medicine

## 2023-10-09 ENCOUNTER — Ambulatory Visit: Payer: MEDICAID | Admitting: Family Medicine

## 2023-10-09 VITALS — BP 136/86 | HR 112 | Wt 149.8 lb

## 2023-10-09 DIAGNOSIS — F17200 Nicotine dependence, unspecified, uncomplicated: Secondary | ICD-10-CM | POA: Diagnosis not present

## 2023-10-09 DIAGNOSIS — Z23 Encounter for immunization: Secondary | ICD-10-CM

## 2023-10-09 DIAGNOSIS — Z789 Other specified health status: Secondary | ICD-10-CM

## 2023-10-09 DIAGNOSIS — F320 Major depressive disorder, single episode, mild: Secondary | ICD-10-CM

## 2023-10-09 DIAGNOSIS — R0609 Other forms of dyspnea: Secondary | ICD-10-CM

## 2023-10-09 MED ORDER — ALBUTEROL SULFATE HFA 108 (90 BASE) MCG/ACT IN AERS: 2.0000 | INHALATION_SPRAY | Freq: Four times a day (QID) | RESPIRATORY_TRACT | 1 refills | Status: DC | PRN

## 2023-10-09 MED ORDER — BUPROPION HCL ER (SR) 150 MG PO TB12: 150.0000 mg | ORAL_TABLET | Freq: Two times a day (BID) | ORAL | 4 refills | Status: AC

## 2023-10-09 NOTE — Progress Notes (Signed)
   Subjective:    Patient ID: Kimberly Thompson, female    DOB: 1961/05/01, 62 y.o.   MRN: [id number]  HPI  Depression, major, single episode, mild (HCC)  Needs flu shot - Plan: Flu vaccine trivalent PF, 6mos and older(Flulaval,Afluria,Fluarix,Fluzone)  Alcohol use  DOE (dyspnea on exertion)  Smoker  Patient dealing with some mild depression related to the loss of her husband she is trying to keep herself busy keep herself occupied she down and denies being suicidal.  She does admit to alcohol use she states some evenings its 4-6 beers in the evening sometimes fewer occasionally more.  She knows she needs to cut back.  She feels like she can do this on her own does not feel she needs to have any special help   She does smoke she knows she needs to quit she had a recent biopsy in her mouth which was negative for cancer she is being followed by specialist for that  She does relate some mild stress   She also has mild dyspnea on exertion with significant activities but denies any significant coughing no hemoptysis Review of Systems     Objective:   Physical Exam General-in no acute distress Eyes-no discharge Lungs-respiratory rate normal, CTA CV-no murmurs,RRR Extremities skin warm dry no edema Neuro grossly normal Behavior normal, alert  Patient not suicidal      Assessment & Plan:   1. Needs flu shot Today - Flu vaccine trivalent PF, 6mos and older(Flulaval,Afluria,Fluarix,Fluzone)  2. Depression, major, single episode, mild (HCC) Patient does not want to do any type of counseling or see mental health Patient working through the stress of losing her husband Patient is open to trying Wellbutrin she has taken it before 150 mg twice daily no history of seizure  3. Alcohol use Patient counseled to cut back on drinking currently doing 4-5 beers every evening I have encouraged her to try to cut back to 1 or 2/day or none gradually over the next few weeks  4. DOE (dyspnea  on exertion) Albuterol as needed patient defers on pulmonary function testing and chest x-ray  5. Smoker Patient was encouraged to quit smoking hopefully Wellbutrin will help  Follow-up within 3 months

## 2023-11-19 ENCOUNTER — Telehealth: Payer: Self-pay | Admitting: *Deleted

## 2023-11-19 NOTE — Telephone Encounter (Unsigned)
Copied from CRM [id number]. Topic: Clinical - Medication Question >> Nov 19, 2023  2:40 PM Prudencio Pair wrote: Reason for CRM: Patient wants to know if Dr. Gerda Diss will prescribe her Protonix & if so, can he send it to [facility] in [place]. She states she's having indigestion and wants to get it cleared up before coming to see Dr. Gerda Diss on the 16th. States she forgot to ask for it when she was in the clinic.

## 2023-11-19 NOTE — Telephone Encounter (Signed)
Protonix 40 mg, #30, 1 daily, 4 refills

## 2023-11-20 MED ORDER — PANTOPRAZOLE SODIUM 40 MG PO TBEC: 40.0000 mg | DELAYED_RELEASE_TABLET | Freq: Every day | ORAL | 0 refills | Status: DC

## 2023-11-20 NOTE — Telephone Encounter (Signed)
Prescription sent electronically to pharmacy. Patient notified. 

## 2023-12-02 ENCOUNTER — Ambulatory Visit: Payer: MEDICAID | Admitting: Family Medicine

## 2023-12-22 ENCOUNTER — Other Ambulatory Visit: Payer: Self-pay | Admitting: Family Medicine

## 2024-02-11 ENCOUNTER — Other Ambulatory Visit: Payer: Self-pay | Admitting: Family Medicine

## 2024-04-02 ENCOUNTER — Telehealth: Payer: Self-pay | Admitting: Family Medicine

## 2024-04-02 NOTE — Telephone Encounter (Signed)
 Copied from CRM [id number]. Topic: Appointments - Scheduling Inquiry for Clinic >> Apr 02, 2024  3:27 PM Crispin Dolphin wrote: Reason for CRM: Patient called. States she received two messages from MyChart but she is not able to use MyChart. She thinks the messages are for waitlist. Checked schedule. No new openings as of phone call. Patient does not have access to Mychart but needs sooner appt because she has a hip problems she thinks she may need referral for that provider has not seen yet. She is still on waitlist. Can we let her know if something sooner comes available

## 2024-04-02 NOTE — Telephone Encounter (Signed)
 Patient is with Dr Fairy Homer at [facility] not [facility]. Thanks

## 2024-04-02 NOTE — Telephone Encounter (Signed)
 Called and spoke with patient Dr Kimberly Thompson does not have anything at this time but we will reach out to the patient if we get a cancellation.

## 2024-05-06 ENCOUNTER — Ambulatory Visit: Payer: MEDICAID | Admitting: Family Medicine

## 2024-05-07 ENCOUNTER — Ambulatory Visit (INDEPENDENT_AMBULATORY_CARE_PROVIDER_SITE_OTHER): Payer: MEDICAID | Admitting: Family Medicine

## 2024-05-07 ENCOUNTER — Encounter: Payer: Self-pay | Admitting: Family Medicine

## 2024-05-07 VITALS — BP 139/85 | HR 80 | Temp 97.9°F | Ht 62.0 in | Wt 145.0 lb

## 2024-05-07 DIAGNOSIS — F439 Reaction to severe stress, unspecified: Secondary | ICD-10-CM | POA: Diagnosis not present

## 2024-05-07 DIAGNOSIS — F17200 Nicotine dependence, unspecified, uncomplicated: Secondary | ICD-10-CM

## 2024-05-07 DIAGNOSIS — K219 Gastro-esophageal reflux disease without esophagitis: Secondary | ICD-10-CM | POA: Diagnosis not present

## 2024-05-07 DIAGNOSIS — B070 Plantar wart: Secondary | ICD-10-CM

## 2024-05-07 DIAGNOSIS — Z79899 Other long term (current) drug therapy: Secondary | ICD-10-CM

## 2024-05-07 DIAGNOSIS — M1612 Unilateral primary osteoarthritis, left hip: Secondary | ICD-10-CM | POA: Diagnosis not present

## 2024-05-07 DIAGNOSIS — Z13220 Encounter for screening for lipoid disorders: Secondary | ICD-10-CM

## 2024-05-07 MED ORDER — NAPROXEN 500 MG PO TABS: 500.0000 mg | ORAL_TABLET | Freq: Two times a day (BID) | ORAL | 1 refills | Status: AC

## 2024-05-07 MED ORDER — VENTOLIN HFA 108 (90 BASE) MCG/ACT IN AERS: 2.0000 | INHALATION_SPRAY | RESPIRATORY_TRACT | 2 refills | Status: AC | PRN

## 2024-05-07 MED ORDER — PANTOPRAZOLE SODIUM 40 MG PO TBEC: 40.0000 mg | DELAYED_RELEASE_TABLET | Freq: Every day | ORAL | 2 refills | Status: AC

## 2024-05-07 MED ORDER — FLUOXETINE HCL 10 MG PO TABS: 10.0000 mg | ORAL_TABLET | Freq: Every day | ORAL | 3 refills | Status: AC

## 2024-05-07 NOTE — Progress Notes (Signed)
   Subjective:    Patient ID: Kimberly Thompson, female    DOB: March 13, 1961, 63 y.o.   MRN: [id number]  HPI  Left hip pain discuss options  Increased pain in the left hip hurts with walking  Patient also has significant soreness on the bottom of her foot  Smoke cessation other than wellbutrin  she is interested in trying Chantix Side effects were discussed   Patient still consumes alcohol but she states she does try to limit the amount She denies excessive intake but states it is more than 1 or 2 beer on some days she has had a longstanding history of alcohol misuse  Review of Systems     Objective:   Physical Exam  General-in no acute distress Eyes-no discharge Lungs-respiratory rate normal, CTA CV-no murmurs,RRR Extremities skin warm dry no edema Neuro grossly normal Behavior normal, alert Plantar wart noted on the bottom of the foot      Assessment & Plan:  1. Arthritis of left hip (Primary) I recommend x-ray check lab work May use Naprosyn stay away from narcotics - Basic Metabolic Panel - XR HIP UNILAT W OR W/O PELVIS 2-3 VIEWS LEFT  2. Stress Recommend Prozac on a daily basis patient under a lot of stress anxiousness and also depression symptoms from losing her husband-patient not suicidal - CBC with Differential - Basic Metabolic Panel  3. Smoker She is a smoker who needs to quit she has been counseled before she is motivated to quit we did discuss Chantix I gave her printouts as well as prescriptions if she has side effects she may stop the medication side effects were discussed in detail - CBC with Differential - Basic Metabolic Panel - Hepatic Function Panel  4. Gastroesophageal reflux disease without esophagitis Add acid blocker healthy diet if not doing better within the next 4 weeks follow-up otherwise follow-up in 4 months - Hepatic Function Panel  5. Screening, lipid Screening - Lipid Panel  6. High risk medication use Lab work ordered - CBC with  Differential - Basic Metabolic Panel - Hepatic Function Panel  7. Plantar wart Referral for podiatry

## 2024-05-08 ENCOUNTER — Other Ambulatory Visit (HOSPITAL_COMMUNITY): Payer: Self-pay

## 2024-05-08 ENCOUNTER — Telehealth: Payer: Self-pay | Admitting: Pharmacy Technician

## 2024-05-08 ENCOUNTER — Ambulatory Visit: Payer: MEDICAID | Admitting: Physician Assistant

## 2024-05-08 NOTE — Telephone Encounter (Signed)
 Pharmacy Patient Advocate Encounter   Received notification from Onbase that prior authorization for Fluoxetine 10mg  tablets is required/requested.   Insurance verification completed.   The patient is insured through [employer] [place] .   Per test claim:  FLUOXETINE CAPSULES is preferred by the insurance.  If suggested medication is appropriate, Please send in a new RX and discontinue this one. If not, please advise as to why it's not appropriate so that we may request a Prior Authorization. Please note, some preferred medications may still require a PA.  If the suggested medications have not been trialed and there are no contraindications to their use, the PA will not be submitted, as it will not be approved.  Pt's pharmacy filled capsules on 05/22/205. Next refill payable on 05/30/2024.

## 2024-05-12 ENCOUNTER — Telehealth: Payer: Self-pay | Admitting: Family Medicine

## 2024-05-12 ENCOUNTER — Other Ambulatory Visit: Payer: Self-pay

## 2024-05-12 DIAGNOSIS — B070 Plantar wart: Secondary | ICD-10-CM

## 2024-05-12 NOTE — Telephone Encounter (Signed)
 Nurses-please put in referral to podiatry for plantar wart patient was seen 05/07/2024 patient is aware thank you

## 2024-05-29 ENCOUNTER — Ambulatory Visit: Payer: MEDICAID | Admitting: Family Medicine

## 2024-07-13 ENCOUNTER — Ambulatory Visit (INDEPENDENT_AMBULATORY_CARE_PROVIDER_SITE_OTHER): Payer: MEDICAID | Admitting: Podiatry

## 2024-07-13 ENCOUNTER — Encounter: Payer: Self-pay | Admitting: Podiatry

## 2024-07-13 VITALS — Ht 62.0 in | Wt 145.0 lb

## 2024-07-13 DIAGNOSIS — D2371 Other benign neoplasm of skin of right lower limb, including hip: Secondary | ICD-10-CM

## 2024-07-13 NOTE — Patient Instructions (Signed)
 Look for salicylic acid 40% medicated pads, cream or ointment and apply to the lesions. This can be bought over the counter, at a pharmacy or online such as [employer]. Apply every night until it resolves. It may  be listed as maximum strength wart or corn remover

## 2024-07-13 NOTE — Progress Notes (Signed)
  Subjective:  Patient ID: Kimberly Thompson, female    DOB: 1961/10/14,  MRN: [id number]  Chief Complaint  Patient presents with   Plantar Warts    Rm 21 Patient is here for plantar wart on the bottom of the right foot. Patient states wart has been present for several years. Patient would nails trimmed today.    63 y.o. female presents with the above complaint. History confirmed with patient.  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  Assessment:   1. Benign neoplasm of skin of right lower extremity      Plan:  Patient was evaluated and treated and all questions answered.  Discussed etiology and treatment of verruca plantaris in detail with the patient as well as multiple treatment options, I recommended debridement of the lesion and application of salicylic acid treatment.  This was performed today as noted below.  Recommend she continue this at home with 40% salicylic acid treatment.  Return as needed.    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  Do not think likely nail care would be a covered benefit for her as she does not have qualifying conditions    Procedure: Destruction of Lesion Location: Right foot Instrumentation: 15 blade. Technique: Debridement of lesion to petechial bleeding.  Salinocaine applied Dressing: Dry, sterile, compression dressing. Disposition: Patient tolerated procedure well.  Leave bandage on 24 hours and then removed continue treatment at home   No follow-ups on file.

## 2024-09-10 ENCOUNTER — Ambulatory Visit: Payer: MEDICAID | Admitting: Nurse Practitioner

## 2024-09-24 ENCOUNTER — Ambulatory Visit: Payer: MEDICAID | Admitting: Nurse Practitioner
